# Patient Record
Sex: Female | Born: 1975 | Hispanic: Yes
Health system: Southern US, Community
[De-identification: ages and names within clinical notes are randomized; demographics above are authoritative.]

---

## 2018-01-21 ENCOUNTER — Ambulatory Visit
Admission: RE | Admit: 2018-01-21 | Discharge: 2018-01-21 | Disposition: A | Discharge status: Home / Self Care | Payer: Self-pay | Source: Ambulatory Visit | Attending: Oncology | Admitting: Oncology

## 2018-01-21 ENCOUNTER — Encounter (INDEPENDENT_AMBULATORY_CARE_PROVIDER_SITE_OTHER): Payer: Self-pay

## 2018-01-21 ENCOUNTER — Ambulatory Visit: Payer: Self-pay | Attending: Oncology | Admitting: *Deleted

## 2018-01-21 ENCOUNTER — Encounter: Payer: Self-pay | Admitting: *Deleted

## 2018-01-21 VITALS — BP 128/82 | HR 65 | Temp 97.7°F | Ht 62.0 in | Wt 159.0 lb

## 2018-01-21 DIAGNOSIS — Z0000 Encounter for general adult medical examination without abnormal findings: Secondary | ICD-10-CM

## 2018-01-21 NOTE — Patient Instructions (Signed)
Gave patient hand-out, Women Staying Healthy, Active and Well from [facility], with education on breast health, pap smears, heart and colon health. 

## 2018-01-21 NOTE — Progress Notes (Signed)
  Subjective:     Patient ID: Lisa Hebert, female   DOB: 01/06/1976, 42 y.o.   MRN: [id number]  HPI   Review of Systems     Objective:   Physical Exam Chest:     Breasts:        Right: No swelling, bleeding, inverted nipple, mass, nipple discharge, skin change or tenderness.        Left: Tenderness present. No swelling, bleeding, inverted nipple, mass, nipple discharge or skin change.          Assessment:     42 year old Hispanic female referred from [facility] for further evaluation of bilateral breast pain.  Lloyda, the interpreter present during the interview and exam.  Patient states she has bilateral breast pain that is associated with her menstrual cycles.  She also states she has a "pinching sensation" at the lateral left breast that has been intermittent since August.  Bilateral breast have a very dense glandular pattern.  There is no dominant mass, skin changes, nipple discharge or lymphadenopathy.  Patient points to the area of concern with is very lateral in the axillary lateral line.  On palpation the patient is tender when I palpate over a bony prominence.  Discussed findings with patient that I feel the area of concern is over a bone.  Explained that we could do a screening mammogram since the area of concern is really not in the breast tissue, but very lateral over a bone and have her return in about 3 months to follow up on her "pinching sensation" again or if she is concerned we can go ahead and do a ultrasound and diagnostic mammogram on a different day.  The patient prefers to have her mammogram today and return for repeat exam in 3 months. Patient has been screened for eligibility.  She does not have any insurance, Medicare or Medicaid.  She also meets financial eligibility.  Hand-out given on the Affordable Care Act.  Risk Assessment    Risk Scores      01/21/2018   Last edited by: Scarlett PrestoShaver, Anne F, RN   5-year risk: 0.5 %   Lifetime risk: 6.2 %             Plan:     Screening mammogram ordered.  Repeat exam in 3 months.  Joellyn Quailshristy Burton to schedule her an appointment.  Will follow-up per [facility] protocol.

## 2018-02-06 ENCOUNTER — Encounter: Payer: Self-pay | Admitting: *Deleted

## 2018-02-06 NOTE — Progress Notes (Addendum)
Patients mammogram has not been read.  If no findings on imaging she is to follow up on 04/22/18 @ 9:00 for repeat clinical breast exam.

## 2018-02-17 ENCOUNTER — Encounter: Payer: Self-pay | Admitting: *Deleted

## 2018-02-17 NOTE — Progress Notes (Signed)
Letter mailed from the [facility] to inform patient of her normal mammogram results.  Patient is to follow-up with annual screening in one year.  Patient has an appointment to return on 04/22/18 for repeat exam.  [facility] to [place].

## 2018-04-22 ENCOUNTER — Ambulatory Visit: Payer: Self-pay

## 2020-03-14 IMAGING — MG DIGITAL SCREENING BILATERAL MAMMOGRAM WITH TOMO AND CAD
8 series · 9 of 24 positions shown · non-contrast
Comparison: 12/22/2013 from [REDACTED].

CLINICAL DATA: Screening.

EXAM:
DIGITAL SCREENING BILATERAL MAMMOGRAM WITH TOMO AND CAD

[L MLO synth-2D]
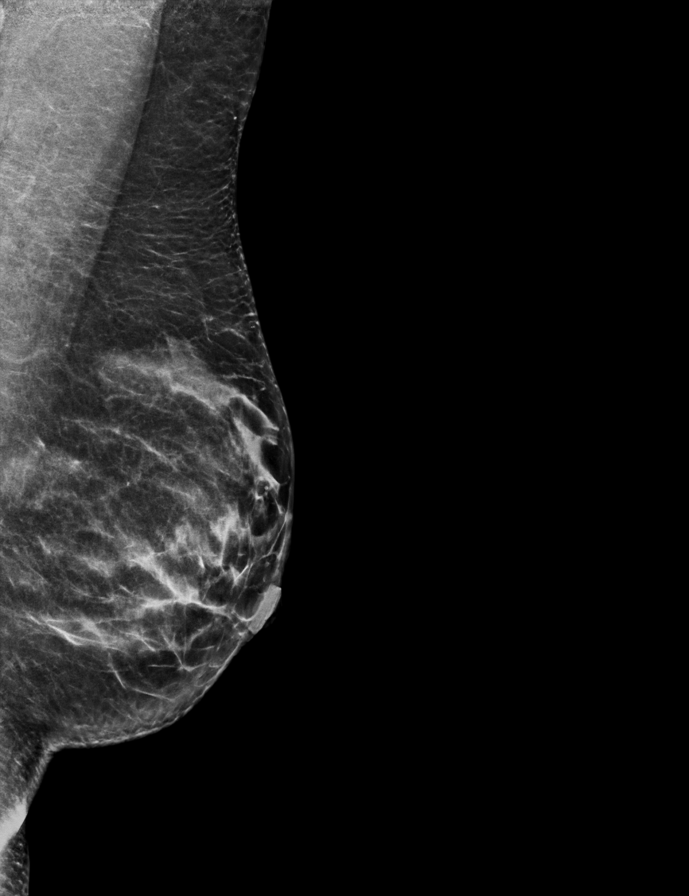

[R CC synth-2D]
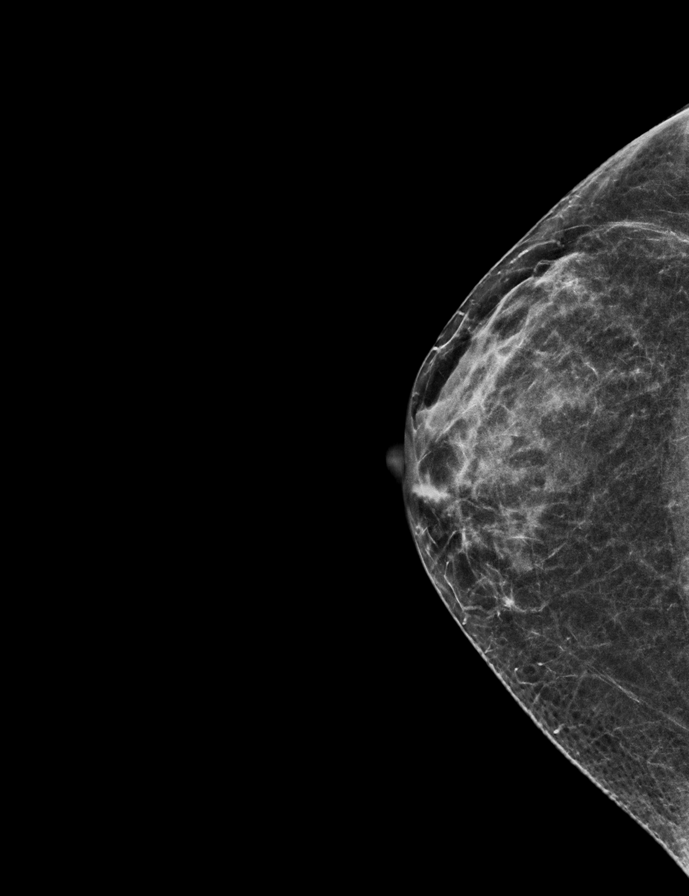

[R MLO synth-2D]
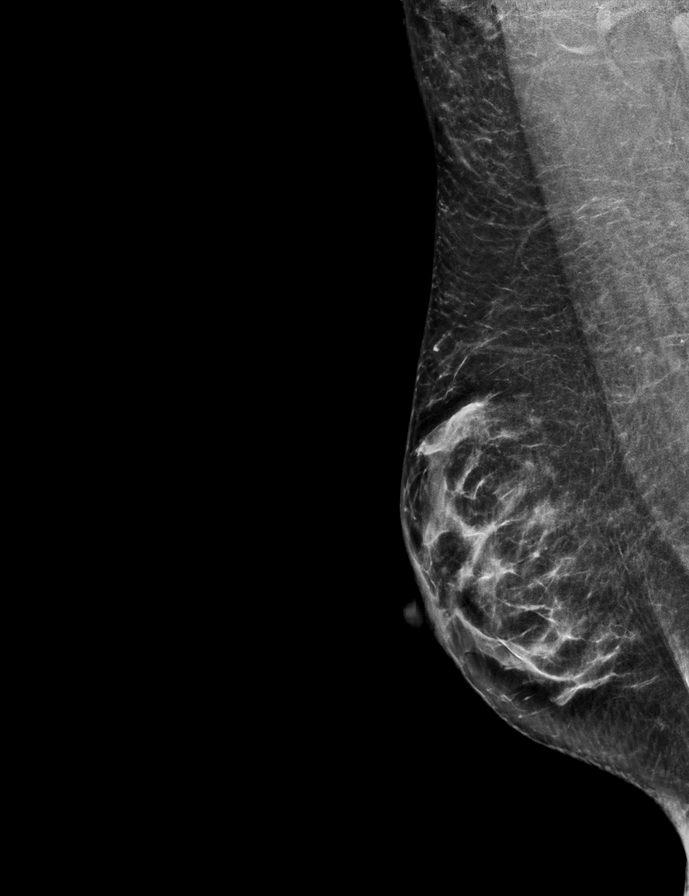

[L CC synth-2D]
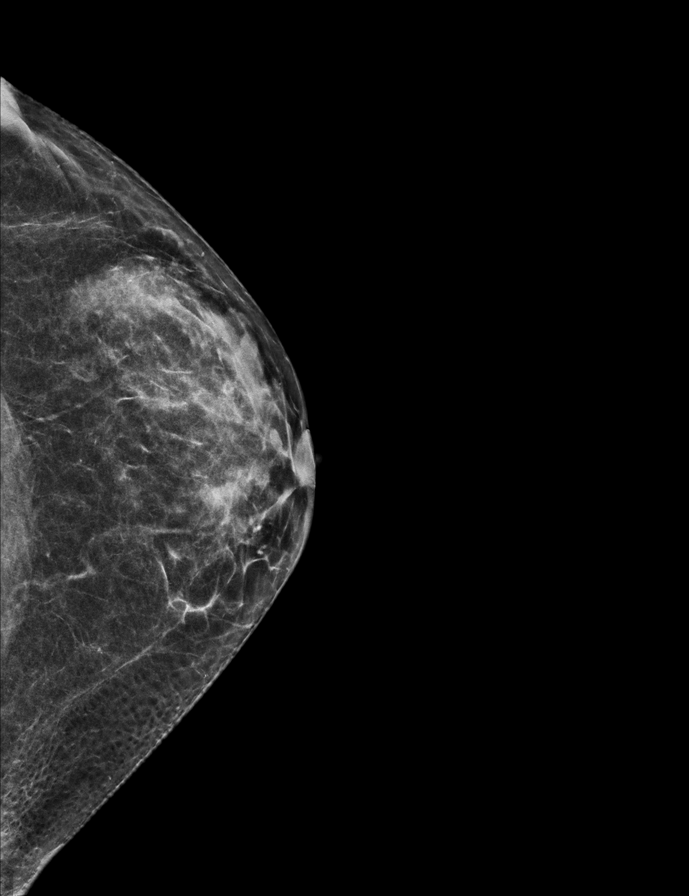

[L MLO tomo · 2 of 60 frames shown]
[frame 20/60]
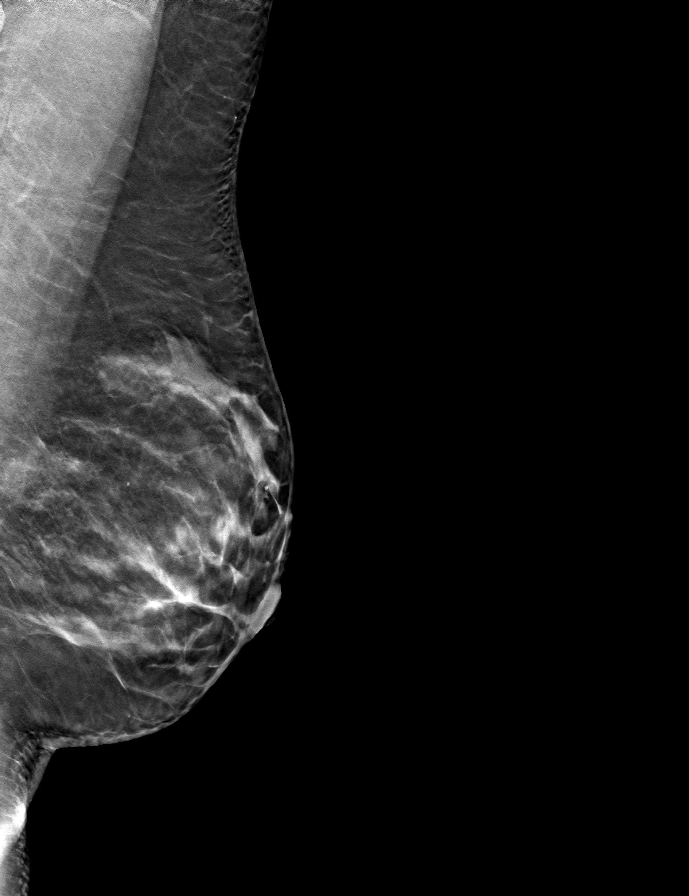
[frame 31/60]
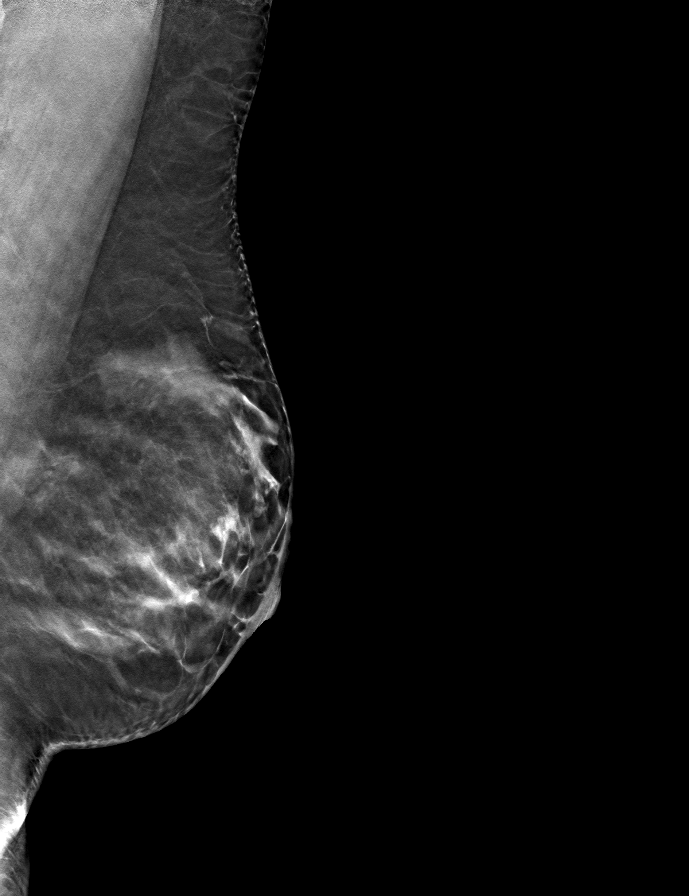

[R CC tomo · tomo slice 25/50.0]
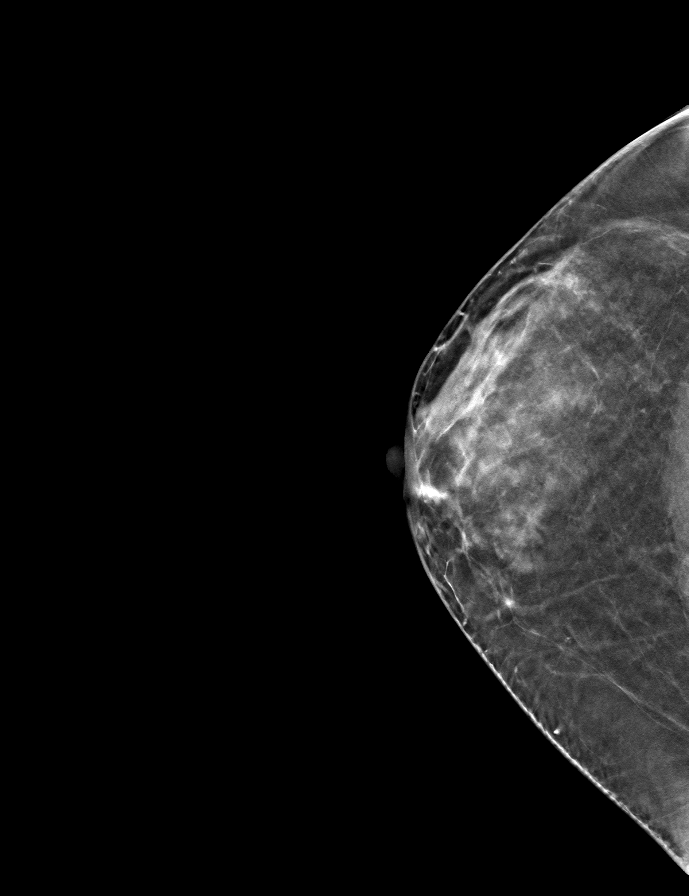

[R MLO tomo · tomo slice 32/63.0]
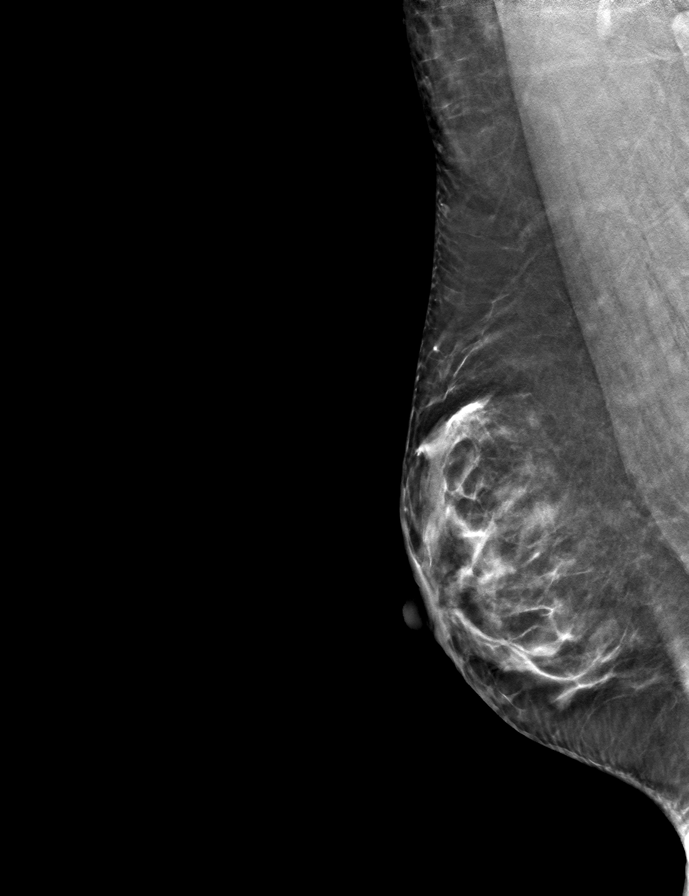

[L CC tomo · tomo slice 27/53.0]
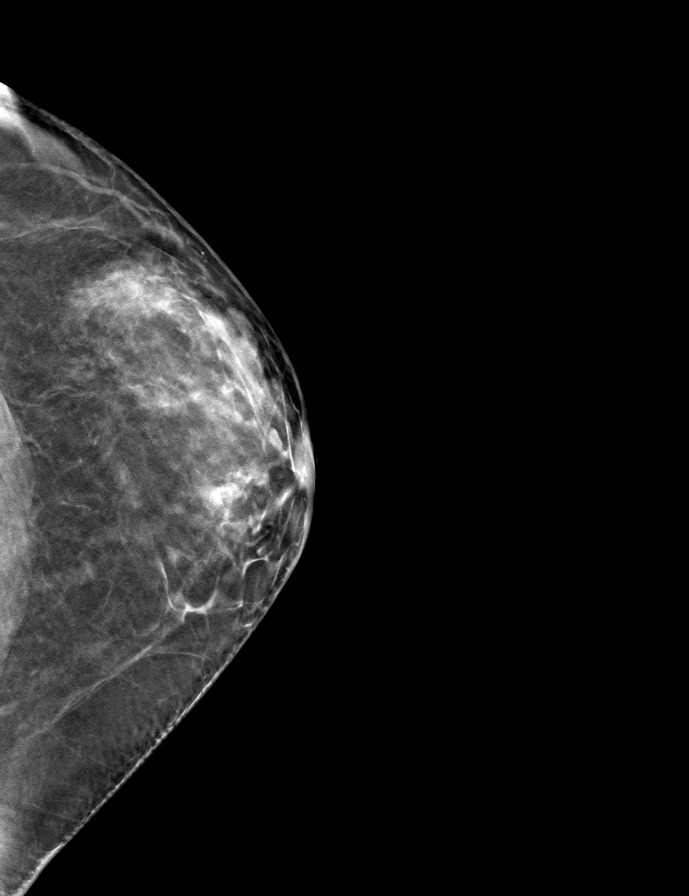

[9 of 24 positions shown; findings below may reference images not displayed]

Awaiting the prior
outside mammograms accounts for the delay in this report.

ACR Breast Density Category c: The breast tissue is heterogeneously
dense, which may obscure small masses.
FINDINGS: There are no findings suspicious for malignancy. Images were
processed with CAD.
IMPRESSION: No mammographic evidence of malignancy. A result letter of this
screening mammogram will be mailed directly to the patient.

RECOMMENDATION:
Screening mammogram in one year. (Code:ZL-[id number])

BI-RADS CATEGORY  1: Negative.
